# Patient Record
Sex: Female | Born: 2000
Health system: Southern US, Community
[De-identification: ages and names within clinical notes are randomized; demographics above are authoritative.]

## PROBLEM LIST (undated history)

## (undated) DIAGNOSIS — M419 Scoliosis, unspecified: Secondary | ICD-10-CM

## (undated) HISTORY — PX: TONSILLECTOMY: SUR1361

---
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
## 2019-01-04 DIAGNOSIS — M546 Pain in thoracic spine: Secondary | ICD-10-CM | POA: Diagnosis not present

## 2019-01-04 DIAGNOSIS — M9903 Segmental and somatic dysfunction of lumbar region: Secondary | ICD-10-CM | POA: Diagnosis not present

## 2019-01-04 DIAGNOSIS — M542 Cervicalgia: Secondary | ICD-10-CM | POA: Diagnosis not present

## 2019-01-04 DIAGNOSIS — M545 Low back pain: Secondary | ICD-10-CM | POA: Diagnosis not present

## 2019-01-15 DIAGNOSIS — Z68.41 Body mass index (BMI) pediatric, 5th percentile to less than 85th percentile for age: Secondary | ICD-10-CM | POA: Diagnosis not present

## 2019-01-15 DIAGNOSIS — Z0000 Encounter for general adult medical examination without abnormal findings: Secondary | ICD-10-CM | POA: Diagnosis not present

## 2019-01-15 DIAGNOSIS — Z713 Dietary counseling and surveillance: Secondary | ICD-10-CM | POA: Diagnosis not present

## 2019-01-15 DIAGNOSIS — Z7182 Exercise counseling: Secondary | ICD-10-CM | POA: Diagnosis not present

## 2019-01-30 ENCOUNTER — Ambulatory Visit: Payer: BLUE CROSS/BLUE SHIELD | Admitting: Psychiatry

## 2019-01-30 ENCOUNTER — Other Ambulatory Visit: Payer: Self-pay

## 2019-01-30 ENCOUNTER — Encounter: Payer: Self-pay | Admitting: Psychiatry

## 2019-01-30 VITALS — Ht 70.0 in | Wt 156.0 lb

## 2019-01-30 DIAGNOSIS — F411 Generalized anxiety disorder: Secondary | ICD-10-CM

## 2019-01-30 DIAGNOSIS — F341 Dysthymic disorder: Secondary | ICD-10-CM

## 2019-01-30 DIAGNOSIS — F410 Panic disorder [episodic paroxysmal anxiety] without agoraphobia: Secondary | ICD-10-CM | POA: Diagnosis not present

## 2019-01-30 MED ORDER — ESCITALOPRAM OXALATE 5 MG PO TABS: 5.0000 mg | ORAL_TABLET | Freq: Every day | ORAL | 0 refills | Status: DC

## 2019-01-30 NOTE — Progress Notes (Signed)
[facility]  Patient ID: Penny Carter,  MRN: [id number]  PCP: Ermalinda Barrios, MD  Date of Evaluation: 01/30/2019 Time spent:10 minutes from 0905 to 0915  Chief Complaint:  Chief Complaint    Anxiety; Depression; Panic Attack      HISTORY/CURRENT STATUS: Penny Carter is seen individually onsite face-to-face with consent not collateral for psychiatric interview and exam in 23-month evaluation and management of anxiety and dysthymia with remission of panic.  At last appointment she had the peak stress of 17 hours academics at [employer] state and has now completed the next semester online stay at home again doing well.  She is confident about applied mathematics major and career also having a minor in [place], planning study abroad in [place] when possible.  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  She plans the summer as a time to taper off her Lexapro and cope by skills she has acquired, though she has generally declined therapy also at [employer] state where she knows it is available as part of her education.  She remains on her OCP and Lexapro 10 mg nightly not requiring the as needed rescue Tranxene, Inderal, or Vistaril available but never needed from last appointment for increasing anxiety.  She has no mania, psychosis, suicidality or dissociation.   Individual Medical History/ Review of Systems: Changes? :Yes She continues OCP for irregular menses having a history of scoliosis otherwise general medical negative.  Allergies: Latex  Current Medications:  Current Outpatient Medications:  .  escitalopram (LEXAPRO) 5 MG tablet, Take 1 tablet (5 mg total) by mouth at bedtime., Disp: 30 tablet, Rfl: 0 .  norethindrone-ethinyl estradiol (JUNEL FE,GILDESS FE,LOESTRIN FE) 1-20 MG-MCG tablet, Take 1 tablet by mouth daily., Disp: , Rfl:    Medication Side Effects: none  Family Medical/ Social History: Changes? No  MENTAL HEALTH  EXAM:  Height 5\' 10"  (1.778 m), weight 156 lb (70.8 kg).Body mass index is 22.38 kg/m.  Otherwise deferred for coronavirus pandemic  General Appearance: Casual and Well Groomed  Eye Contact:  Good  Speech:  Clear and Coherent, Normal Rate and Talkative  Volume:  Normal  Mood:  Euthymic  Affect:  Anxious full range of affect  Thought Process:  Goal Directed  Orientation:  Full (Time, Place, and Person)  Thought Content: Obsessions   Suicidal Thoughts:  No  Homicidal Thoughts:  No  Memory:  Immediate;   Good Remote;   Good  Judgement:  Good  Insight:  Good  Psychomotor Activity:  Normal  Concentration:  Concentration: Good and Attention Span: Good  Recall:  Good  Fund of Knowledge: Good  Language: Good  Assets:  Intimacy Social Support Vocational/Educational  ADL's:  Intact  Cognition: WNL  Prognosis:  Good    DIAGNOSES:    ICD-10-CM   1. GAD (generalized anxiety disorder) F41.1 escitalopram (LEXAPRO) 5 MG tablet  2. Panic disorder F41.0 escitalopram (LEXAPRO) 5 MG tablet  3. Severe early onset persistent depressive disorder in full remission with anxious distress and pure persistent depressive syndrome F34.1 escitalopram (LEXAPRO) 5 MG tablet    Receiving Psychotherapy: No    RECOMMENDATIONS: Goal is to discontinue 3 years of Lexapro 10 mg nightly, so she is E scribed 5 mg tablets to take 1 every bedtime for 2 weeks, then one half every bedtime for 2 weeks then stop as #30 with no refill sent to [facility] on [address] for GAD and dysthymia in remission.  We discuss management of discontinuation or relapse symptoms as return if needed for recurrence of symptoms, otherwise closure secures college and career readiness and fulfilling social relations.   Chauncey MannGlenn E , MD

## 2019-02-09 ENCOUNTER — Other Ambulatory Visit: Payer: Self-pay | Admitting: Psychiatry

## 2019-02-09 DIAGNOSIS — F410 Panic disorder [episodic paroxysmal anxiety] without agoraphobia: Secondary | ICD-10-CM

## 2019-02-09 DIAGNOSIS — F411 Generalized anxiety disorder: Secondary | ICD-10-CM

## 2019-02-09 DIAGNOSIS — F341 Dysthymic disorder: Secondary | ICD-10-CM

## 2019-02-19 DIAGNOSIS — R42 Dizziness and giddiness: Secondary | ICD-10-CM | POA: Diagnosis not present

## 2019-04-19 DIAGNOSIS — Z20818 Contact with and (suspected) exposure to other bacterial communicable diseases: Secondary | ICD-10-CM | POA: Diagnosis not present

## 2019-07-13 DIAGNOSIS — Z20828 Contact with and (suspected) exposure to other viral communicable diseases: Secondary | ICD-10-CM | POA: Diagnosis not present

## 2019-07-23 DIAGNOSIS — Z6820 Body mass index (BMI) 20.0-20.9, adult: Secondary | ICD-10-CM | POA: Diagnosis not present

## 2019-07-23 DIAGNOSIS — Z118 Encounter for screening for other infectious and parasitic diseases: Secondary | ICD-10-CM | POA: Diagnosis not present

## 2019-07-23 DIAGNOSIS — Z01419 Encounter for gynecological examination (general) (routine) without abnormal findings: Secondary | ICD-10-CM | POA: Diagnosis not present

## 2019-07-30 DIAGNOSIS — D170 Benign lipomatous neoplasm of skin and subcutaneous tissue of head, face and neck: Secondary | ICD-10-CM | POA: Diagnosis not present

## 2019-07-31 DIAGNOSIS — Z3042 Encounter for surveillance of injectable contraceptive: Secondary | ICD-10-CM | POA: Diagnosis not present

## 2019-08-28 ENCOUNTER — Other Ambulatory Visit: Payer: Self-pay

## 2019-08-28 ENCOUNTER — Encounter: Payer: Self-pay | Admitting: Psychiatry

## 2019-08-28 ENCOUNTER — Ambulatory Visit (INDEPENDENT_AMBULATORY_CARE_PROVIDER_SITE_OTHER): Payer: BC Managed Care – PPO | Admitting: Psychiatry

## 2019-08-28 VITALS — Ht 70.0 in | Wt 142.0 lb

## 2019-08-28 DIAGNOSIS — F411 Generalized anxiety disorder: Secondary | ICD-10-CM

## 2019-08-28 DIAGNOSIS — F341 Dysthymic disorder: Secondary | ICD-10-CM

## 2019-08-28 DIAGNOSIS — F410 Panic disorder [episodic paroxysmal anxiety] without agoraphobia: Secondary | ICD-10-CM | POA: Diagnosis not present

## 2019-08-28 MED ORDER — ESCITALOPRAM OXALATE 10 MG PO TABS: 10.0000 mg | ORAL_TABLET | Freq: Every day | ORAL | 1 refills | Status: DC

## 2019-08-28 NOTE — Progress Notes (Signed)
[facility]  Patient ID: Penny Carter,  MRN: [id number]  PCP: Penny Sears, MD (Inactive)  Date of Evaluation: 08/28/2019 Time spent:20 minutes from 1040 to 1100  Chief Complaint:  Chief Complaint    Depression; Anxiety; Panic Attack      HISTORY/CURRENT STATUS: Penny Carter is seen onsite in office face-to-face 20 minutes with consent with epic collateral for psychiatric interview and exam in 71-month evaluation and management of panic and generalized anxiety disorders and comorbid dysthymic disorder with anxious distress.  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  She was on Lexapro for 3-1/2 years as she finished high school and started college always very conservative with medication taking the 5 mg dose for the first 4 months and then switching to 10 mg nightly tolerated well with efficacy though still remaining generally modestly anxious.  Off of Lexapro this fall, her grades were A's and B's and she had a job at [employer], however she experienced crying spells and depression worse 1 month ago and has had 1 panic attack last weekend.  She started therapy with mother's friend who does virtual psychotherapy from her [place] location which patient has found helpful but not resolving.  In fact, she now presents whether seasonal, therapy conclusion, or personal recapitulation to restart medication.  She initially discusses her question of the Lexapro possibly being inadequate therapeutically as she has recurrence now months later  I cannot agree with her that the relapse would be due to the Lexapro in any way but understand the need for hope in resolving the problems again for which starting new medication is certainly appropriate though not medically necessary.  She  does not wish to consider currently the missing out on study abroad in [place] when she is majoring in [employer] with a minor in Spanish plan that is part of her loss.  She also missed a cruise because of Covid.  She has never accepted prescriptions for as needed use such as for panic including Tranxene, Inderal, and Vistaril.  Patient can dissipate strong painful emotion in the session somatically and nonverbally but she will not discuss options for treatment that can be intensified if needed.  She knows that maternal great great grandmother had anxiety even at age 49 years.  The patient's younger sister and mother have generally been controlling in style while father disengaged from the family at divorce with a lawsuit against the therapist providing the counseling surrounding divorce.  Patient has no suicidality, psychosis, delirium, or mania.  Anxiety  Presents for follow-up visit. Symptoms include excessive worry, muscle tension, nausea, presyncopy, nervous/anxious behavior, decreased concentration, depressed mood, and panic. Patient reports no confusion, insomnia or suicidal ideas. Symptoms occur most days. The most recent panic episode lasted 30 minutes last Sunday. The severity of symptoms is moderate. The patient sleeps 6 hours per night. The quality of sleep is good. Nighttime awakenings: none. Compliance with medications is 76-100%. Side effects of treatment include GI discomfort.   Depression  This is a chronic problem that started more than 4 year ago.   The recurrence of the problem now occurs daily.  The problem has fully recurred since recovery seemed likely 6 months ago.  Associated symptoms include decreased concentration, crying spells, feelings of guilt in sense of loss, unwillingness to discuss dissolution of  family impact upon all, and hopelessness .  Symptoms do not include mood swings, insomnia, or suicidal ideas.  Past medical history includes anxiety.    Individual Medical  History/ Review of Systems: Changes? :Yes  wieght was 169 pounds 1 year ago, 156 some 6 months ago, and now 142 pounds relative to diet and exercise.  She continues birth control pills.   Allergies: Latex  Current Medications:  Current Outpatient Medications:  .  escitalopram (LEXAPRO) 10 MG tablet, Take 1 tablet (10 mg total) by mouth at bedtime., Disp: 90 tablet, Rfl: 1 .  norethindrone-ethinyl estradiol (JUNEL FE,GILDESS FE,LOESTRIN FE) 1-20 MG-MCG tablet, Take 1 tablet by mouth daily., Disp: , Rfl:    Medication Side Effects: none  Family Medical/ Social History: Changes? No  MENTAL HEALTH EXAM:  Height 5\' 10"  (1.778 m), weight 142 lb (64.4 kg).Body mass index is 20.37 kg/m. Muscle strengths and tone 5/5, postural reflexes and gait 0/0, and AIMS = 0 otherwise deferred for coronavirus shutdown  General Appearance: Casual, Guarded and Well Groomed  Eye Contact:  Fair  Speech:  Blocked, Clear and Coherent, Normal Rate and Talkative  Volume:  Normal  Mood:  Anxious, Depressed, Dysphoric, Hopeless and Worthless  Affect:  Congruent, Depressed, Inappropriate, Restricted, Tearful and Anxious  Thought Process:  Coherent, Goal Directed, Irrelevant and Descriptions of Associations: Tangential  Orientation:  Full (Time, Place, and Person)  Thought Content: Logical, Ilusions, Obsessions, Rumination and Tangential   Suicidal Thoughts:  No  Homicidal Thoughts:  No  Memory:  Immediate;   Good Remote;   Good  Judgement:  Fair  Insight:  Fair  Psychomotor Activity:  Normal, Decreased and Mannerisms  Concentration:  Concentration: Fair and Attention Span: Good  Recall:  Good  Fund of Knowledge: Good  Language: Good  Assets:  Physical Health Resilience Talents/Skills Vocational/Educational  ADL's:  Intact  Cognition: WNL  Prognosis:  Good    DIAGNOSES:    ICD-10-CM   1. Persistent depressive disorder with anxious distress, currently moderate  F34.1 escitalopram (LEXAPRO) 10 MG tablet   2. GAD (generalized anxiety disorder)  F41.1 escitalopram (LEXAPRO) 10 MG tablet  3. Panic disorder  F41.0 escitalopram (LEXAPRO) 10 MG tablet    Receiving Psychotherapy: Yes With psychotherapist who is mother's friend by telemedicine from   RECOMMENDATIONS: The patient seems to feel better by the end of the session and function somewhat better.  However, she then closes the discussion about medication options and other aspects of treatment, stating she will be okay and just needs to restart her Lexapro.  She allows psychosupportive psychoeducation on diagnoses including potential clinical courses and treatment needs and options.  She declines to change or otherwise add other medication.  Lexapro is E scribed 10 mg every bedtime sent as #90 with 1 refill to [facility] for dysthymia, generalized anxiety, and history of panic disorder.  Patient understands that follow-up is necessary in 3 to 6 weeks if symptoms are not responding fully as she by the closure of session expects the same beneficial response as for last course of treatment.  She only allows the reception desk to schedule an appointment in 6 months after her next semester is over but understands ways to contact me or return early even if by telemedicine.  Prevention and monitoring, safety hygiene, and crisis plans if needed are reviewed and updated.   [place], MD

## 2019-09-11 ENCOUNTER — Ambulatory Visit: Payer: Self-pay | Attending: Internal Medicine

## 2019-09-11 ENCOUNTER — Other Ambulatory Visit: Payer: BLUE CROSS/BLUE SHIELD

## 2019-09-11 DIAGNOSIS — Z20822 Contact with and (suspected) exposure to covid-19: Secondary | ICD-10-CM | POA: Insufficient documentation

## 2019-09-12 LAB — NOVEL CORONAVIRUS, NAA: SARS-CoV-2, NAA: NOT DETECTED

## 2019-10-25 DIAGNOSIS — Z3042 Encounter for surveillance of injectable contraceptive: Secondary | ICD-10-CM | POA: Diagnosis not present

## 2020-02-17 ENCOUNTER — Other Ambulatory Visit: Payer: Self-pay | Admitting: Psychiatry

## 2020-02-17 DIAGNOSIS — F410 Panic disorder [episodic paroxysmal anxiety] without agoraphobia: Secondary | ICD-10-CM

## 2020-02-17 DIAGNOSIS — F341 Dysthymic disorder: Secondary | ICD-10-CM

## 2020-02-17 DIAGNOSIS — F411 Generalized anxiety disorder: Secondary | ICD-10-CM

## 2020-02-26 ENCOUNTER — Other Ambulatory Visit: Payer: Self-pay

## 2020-02-26 ENCOUNTER — Encounter: Payer: Self-pay | Admitting: Psychiatry

## 2020-02-26 ENCOUNTER — Ambulatory Visit (INDEPENDENT_AMBULATORY_CARE_PROVIDER_SITE_OTHER): Payer: BC Managed Care – PPO | Admitting: Psychiatry

## 2020-02-26 VITALS — Ht 70.0 in | Wt 144.0 lb

## 2020-02-26 DIAGNOSIS — F411 Generalized anxiety disorder: Secondary | ICD-10-CM

## 2020-02-26 DIAGNOSIS — F341 Dysthymic disorder: Secondary | ICD-10-CM | POA: Diagnosis not present

## 2020-02-26 DIAGNOSIS — F410 Panic disorder [episodic paroxysmal anxiety] without agoraphobia: Secondary | ICD-10-CM

## 2020-02-26 MED ORDER — ESCITALOPRAM OXALATE 20 MG PO TABS: 20.0000 mg | ORAL_TABLET | Freq: Every day | ORAL | 1 refills | Status: DC

## 2020-02-26 NOTE — Progress Notes (Signed)
[facility]  Patient ID: Penny Carter,  MRN: [id number]  PCP: Ermalinda Barrios, MD  Date of Evaluation: 02/26/2020 Time spent:25 minutes from 1010 to 1035  Chief Complaint:  Chief Complaint    Depression; Anxiety; Panic Attack      HISTORY/CURRENT STATUS: Penny Carter is seen Onsite in office 20 minutes face-to-face individually with consent with epic collateral for psychiatric interview and exam in 62-month evaluation and management of dysthymia with anxious distress, generalized anxiety, and panic disorder.  Patient is stressed as she expected while grieving in a protracted way for the death of maternal great grandmother approximately 3 weeks ago.  At last appointment she mentioned that she identified with this maternal great grandmother who had anxiety even in her 64's.  The patient had experienced panic when she learned of the grandmother being in the hospital care in the interim.  Patient completed therapy with the friend of mother from [place] by virtual with closure improvement.  However with the loss of the great grandmother, the patient is again very anxious with easy panic and despair at least moderate.  She will be back onsite in all classes at [employer] in the fall as a junior having only one hybrid class I day weekly through the school year.  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  She declines Ativan for her current anxiety, and also for depression she is agreeable to increasing Lexapro.  She had started Lexapro at 5 mg for 4 months and advanced to 10 mg since then for the last nearly 4 years.  Adult maturation is evident including in responsibilities, and she requests to increase the Lexapro for recurrent depressive episodes  She has no mania, suicidality, psychosis or delirium    Anxiety             Presents forfollow-upvisit. Symptoms includeexcessive worry,muscle tension,  nervous/anxious behavior, decreased concentration,depressed mood, and panic attack. Patient reports noconfusion, insomnia, nausea, presyncope,or suicidal ideas. Symptoms occurmost days. The most recent panic episode lasted 20 minutes. The severity of symptoms ismoderate. The patient sleeps6 hoursper night. The quality of sleep isgood. Nighttime awakenings:none. Compliance with medications is76-100%. Side effects of treatment includeGI discomfort.  Depression             This is a chronicproblem that started more than 4 year ago occurring daily recently intensified with mourning the death of great grandmother.Associated symptoms include decreased concentration, crying spells, feelings of guilt, sense of loss, with more dissolution of family, and hopelessness .  Symptomsdo not include mood swings, insomnia, or suicidal ideas.Past medical history includes anxiety.  Individual Medical History/ Review of Systems: Changes? :Yes Weight is up 2 pounds from last appointment but still down 12 pounds from the preceding 1 year ago.  She has Depo-Provera now instead of the birth control pill.  Otherwise general health is good.  Allergies: Latex  Current Medications:  Current Outpatient Medications:  .  escitalopram (LEXAPRO) 20 MG tablet, Take 1 tablet (20 mg total) by mouth at bedtime., Disp: 90 tablet, Rfl: 1 .  norethindrone-ethinyl estradiol (JUNEL FE,GILDESS FE,LOESTRIN FE) 1-20 MG-MCG tablet, Take 1 tablet by mouth daily., Disp: , Rfl:   Medication Side Effects: none  Family Medical/ Social History: Changes? No  MENTAL HEALTH EXAM:  Height 5\' 10"  (1.778 m), weight 144 lb (65.3 kg).Body mass index is 20.66 kg/m. Muscle strengths and tone 5/5, postural reflexes and gait 0/0,  and AIMS = 0.  General Appearance: Casual, Meticulous and Well Groomed  Eye Contact:  Good  Speech:  Clear and Coherent, Normal Rate and Talkative  Volume:  Normal  Mood:  Anxious, Depressed, Dysphoric and  Hopeless  Affect:  Congruent, Depressed, Inappropriate, Full Range and Anxious  Thought Process:  Coherent, Goal Directed, Irrelevant and Descriptions of Associations: Tangential  Orientation:  Full (Time, Place, and Person)  Thought Content: Rumination and Tangential   Suicidal Thoughts:  No  Homicidal Thoughts:  No  Memory:  Immediate;   Good and Fair Remote;   Good  Judgement:  Good  Insight:  Fair  Psychomotor Activity:  Normal and Mannerisms  Concentration:  Concentration: Fair and Attention Span: Good  Recall:  Good  Fund of Knowledge: Good  Language: Good  Assets:  Desire for Improvement Resilience Social Support Talents/Skills  ADL's:  Intact  Cognition: WNL  Prognosis:  Good    DIAGNOSES:    ICD-10-CM   1. Persistent depressive disorder with anxious distress, currently moderate  F34.1 escitalopram (LEXAPRO) 20 MG tablet  2. Panic disorder  F41.0 escitalopram (LEXAPRO) 20 MG tablet  3. 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  Patient can likely work through all of these changes now which had been fixated and suspended in time.  Cognitive behavioral grief and loss, social skills, and frustration management are integrated with symptom treatment matching for medication concluding to increase Lexapro 10 mg existing tablet supply to 1.5 tablets total 15 mg every bedtime until supply exhausted then advance to 20 mg tablet as 1  every bedtime as #90 with 1 refill sent to [facility] for dysthymia, generalized anxiety, and panic disorder.  She continues birth control pill and has not needed rescue medication but may call for Ativan as needed for panic or anxiety.  She returns for follow-up in 6 months refusing to schedule sooner unless found necessary. Chauncey Mann, MD

## 2020-02-29 DIAGNOSIS — Z3202 Encounter for pregnancy test, result negative: Secondary | ICD-10-CM | POA: Diagnosis not present

## 2020-02-29 DIAGNOSIS — Z3042 Encounter for surveillance of injectable contraceptive: Secondary | ICD-10-CM | POA: Diagnosis not present

## 2020-05-28 DIAGNOSIS — R070 Pain in throat: Secondary | ICD-10-CM | POA: Diagnosis not present

## 2020-05-28 DIAGNOSIS — J029 Acute pharyngitis, unspecified: Secondary | ICD-10-CM | POA: Diagnosis not present

## 2020-05-30 DIAGNOSIS — Z3042 Encounter for surveillance of injectable contraceptive: Secondary | ICD-10-CM | POA: Diagnosis not present

## 2020-06-18 ENCOUNTER — Encounter: Payer: Self-pay | Admitting: Psychiatry

## 2020-08-26 ENCOUNTER — Ambulatory Visit: Payer: Self-pay | Admitting: Psychiatry

## 2020-08-26 DIAGNOSIS — Z6822 Body mass index (BMI) 22.0-22.9, adult: Secondary | ICD-10-CM | POA: Diagnosis not present

## 2020-08-26 DIAGNOSIS — Z01419 Encounter for gynecological examination (general) (routine) without abnormal findings: Secondary | ICD-10-CM | POA: Diagnosis not present

## 2020-08-26 DIAGNOSIS — Z113 Encounter for screening for infections with a predominantly sexual mode of transmission: Secondary | ICD-10-CM | POA: Diagnosis not present

## 2020-08-27 ENCOUNTER — Ambulatory Visit: Payer: Self-pay | Admitting: Psychiatry

## 2020-09-01 ENCOUNTER — Other Ambulatory Visit: Payer: Self-pay | Admitting: Psychiatry

## 2020-09-01 DIAGNOSIS — F341 Dysthymic disorder: Secondary | ICD-10-CM

## 2020-09-01 DIAGNOSIS — F411 Generalized anxiety disorder: Secondary | ICD-10-CM

## 2020-09-01 DIAGNOSIS — F410 Panic disorder [episodic paroxysmal anxiety] without agoraphobia: Secondary | ICD-10-CM

## 2020-09-05 DIAGNOSIS — Z3042 Encounter for surveillance of injectable contraceptive: Secondary | ICD-10-CM | POA: Diagnosis not present

## 2020-09-05 DIAGNOSIS — Z3200 Encounter for pregnancy test, result unknown: Secondary | ICD-10-CM | POA: Diagnosis not present

## 2020-09-19 ENCOUNTER — Ambulatory Visit: Payer: Self-pay | Admitting: Adult Health

## 2020-10-10 ENCOUNTER — Other Ambulatory Visit: Payer: Self-pay

## 2020-10-10 ENCOUNTER — Encounter: Payer: Self-pay | Admitting: Adult Health

## 2020-10-10 ENCOUNTER — Ambulatory Visit (INDEPENDENT_AMBULATORY_CARE_PROVIDER_SITE_OTHER): Payer: BC Managed Care – PPO | Admitting: Adult Health

## 2020-10-10 DIAGNOSIS — F411 Generalized anxiety disorder: Secondary | ICD-10-CM

## 2020-10-10 DIAGNOSIS — F341 Dysthymic disorder: Secondary | ICD-10-CM | POA: Diagnosis not present

## 2020-10-10 DIAGNOSIS — F410 Panic disorder [episodic paroxysmal anxiety] without agoraphobia: Secondary | ICD-10-CM | POA: Diagnosis not present

## 2020-10-10 MED ORDER — ESCITALOPRAM OXALATE 20 MG PO TABS: 20.0000 mg | ORAL_TABLET | Freq: Every day | ORAL | 3 refills | Status: DC

## 2020-10-10 NOTE — Progress Notes (Signed)
Penny Carter [id number] 2001-01-12 20 y.o.  Subjective:   Patient ID:  Penny Carter is a 20 y.o. (DOB 10/22/2000) female.  Chief Complaint: No chief complaint on file.   HPI Penny Carter presents to the office today for follow-up of GAD, panic disorder, persistent depression.  Describes mood today as "ok". Pleasant. Mood symptoms - denies depression, anxiety, and irritability. Stating "I'm doing pretty good". Reports Lexapro 20mg  continues to work well for her. Stable interest and motivation. Taking medications as prescribed.  Energy levels stable. Active, has a regular exercise routine.  Enjoys some usual interests and activities. Student at [employer] - junior. Mother and sister local. Spending time with family and friends. Trip to [facility] planned over summer break. Appetite adequate. Weight stable. Sleeps well most nights. Averages 9 to 10 hours. Focus and concentration stable. Completing tasks. Managing aspects of household. 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 Denies SI or HI.  Denies AH or VH.  Previous medication trials: Denies   Review of Systems:  Review of Systems  Musculoskeletal: Negative for gait problem.  Neurological: Negative for tremors.  Psychiatric/Behavioral:       Please refer to HPI    Medications: I have reviewed the patient's current medications.  Current Outpatient Medications  Medication Sig Dispense Refill  . escitalopram (LEXAPRO) 20 MG tablet Take 1 tablet (20 mg total) by mouth at bedtime. 90 tablet 3  . norethindrone-ethinyl estradiol (JUNEL FE,GILDESS FE,LOESTRIN FE) 1-20 MG-MCG tablet Take 1 tablet by mouth daily.     No current facility-administered medications for this visit.    Medication Side Effects: None  Allergies:  Allergies  Allergen Reactions  . Latex     Past Medical History:  Diagnosis Date  . Scoliosis     No family history on file.  Social History    Socioeconomic History  . Marital status: Single    Spouse name: Not on file  . Number of children: Not on file  . Years of education: Not on file  . Highest education level: Not on file  Occupational History  . Not on file  Tobacco Use  . Smoking status: Never Smoker  . Smokeless tobacco: Never Used  Vaping Use  . Vaping Use: Never used  Substance and Sexual Activity  . Alcohol use: No  . Drug use: No  . Sexual activity: Not on file  Other Topics Concern  . Not on file  Social History Narrative  . Not on file   Social Determinants of Health   Financial Resource Strain: Not on file  Food Insecurity: Not on file  Transportation Needs: Not on file  Physical Activity: Not on file  Stress: Not on file  Social Connections: Not on file  Intimate Partner Violence: Not on file    Past Medical History, Surgical history, Social history, and Family history were reviewed and updated as appropriate.   Please see review of systems for further details on the patient's review from today.   Objective:   Physical Exam:  There were no vitals taken for this visit.  Physical Exam Constitutional:      General: She is not in acute distress. Musculoskeletal:        General: No deformity.  Neurological:     Mental Status: She is alert and oriented to person, place, and time.     Coordination: Coordination normal.  Psychiatric:        Attention  and Perception: Attention and perception normal. She does not perceive auditory or visual hallucinations.        Mood and Affect: Mood normal. Mood is not anxious or depressed. Affect is not labile, blunt, angry or inappropriate.        Speech: Speech normal.        Behavior: Behavior normal.        Thought Content: Thought content normal. Thought content is not paranoid or delusional. Thought content does not include homicidal or suicidal ideation. Thought content does not include homicidal or suicidal plan.        Cognition and Memory:  Cognition and memory normal.        Judgment: Judgment normal.     Comments: Insight intact     Lab Review:  No results found for: NA, K, CL, CO2, GLUCOSE, BUN, CREATININE, CALCIUM, PROT, ALBUMIN, AST, ALT, ALKPHOS, BILITOT, GFRNONAA, GFRAA  No results found for: WBC, RBC, HGB, HCT, PLT, MCV, MCH, MCHC, RDW, LYMPHSABS, MONOABS, EOSABS, BASOSABS  No results found for: POCLITH, LITHIUM   No results found for: PHENYTOIN, PHENOBARB, VALPROATE, CBMZ   .res Assessment: Plan:    Plan:  PDMP reviewed  1. Lexapro 20mg    Read and reviewed note with patient for accuracy.   RTC 6 months  Patient advised to contact office with any questions, adverse effects, or acute worsening in signs and symptoms.   Diagnoses and all orders for this visit:  Persistent depressive disorder with anxious distress, currently moderate -     escitalopram (LEXAPRO) 20 MG tablet; Take 1 tablet (20 mg total) by mouth at bedtime.  Panic disorder -     escitalopram (LEXAPRO) 20 MG tablet; Take 1 tablet (20 mg total) by mouth at bedtime.  GAD (generalized anxiety disorder) -     escitalopram (LEXAPRO) 20 MG tablet; Take 1 tablet (20 mg total) by mouth at bedtime.     Please see After Visit Summary for patient specific instructions.  Future Appointments  Date Time Provider Department Center  04/13/2021  9:00 AM Penny Carter, 04/15/2021, NP [facility] None    No orders of the defined types were placed in this encounter.   -------------------------------

## 2020-10-23 DIAGNOSIS — M533 Sacrococcygeal disorders, not elsewhere classified: Secondary | ICD-10-CM | POA: Diagnosis not present

## 2020-10-23 DIAGNOSIS — S322XXA Fracture of coccyx, initial encounter for closed fracture: Secondary | ICD-10-CM | POA: Diagnosis not present

## 2021-01-06 DIAGNOSIS — Z3042 Encounter for surveillance of injectable contraceptive: Secondary | ICD-10-CM | POA: Diagnosis not present

## 2021-01-06 DIAGNOSIS — Z309 Encounter for contraceptive management, unspecified: Secondary | ICD-10-CM | POA: Diagnosis not present

## 2021-01-08 DIAGNOSIS — Z3200 Encounter for pregnancy test, result unknown: Secondary | ICD-10-CM | POA: Diagnosis not present

## 2021-01-08 DIAGNOSIS — Z3042 Encounter for surveillance of injectable contraceptive: Secondary | ICD-10-CM | POA: Diagnosis not present

## 2021-01-20 DIAGNOSIS — M25561 Pain in right knee: Secondary | ICD-10-CM | POA: Diagnosis not present

## 2021-01-20 DIAGNOSIS — L700 Acne vulgaris: Secondary | ICD-10-CM | POA: Diagnosis not present

## 2021-01-20 DIAGNOSIS — Z79899 Other long term (current) drug therapy: Secondary | ICD-10-CM | POA: Diagnosis not present

## 2021-04-13 ENCOUNTER — Telehealth: Payer: Self-pay | Admitting: Adult Health

## 2021-04-13 ENCOUNTER — Ambulatory Visit: Payer: BC Managed Care – PPO | Admitting: Adult Health

## 2021-04-13 DIAGNOSIS — Z3202 Encounter for pregnancy test, result negative: Secondary | ICD-10-CM | POA: Diagnosis not present

## 2021-04-13 DIAGNOSIS — Z3042 Encounter for surveillance of injectable contraceptive: Secondary | ICD-10-CM | POA: Diagnosis not present

## 2021-04-13 NOTE — Telephone Encounter (Signed)
Requesting refill on Lexapro called to:  [id number] [place], [place] - [address]  Phone:  [phone]  Fax:  [phone]

## 2021-04-13 NOTE — Telephone Encounter (Signed)
Please schedule appt

## 2021-04-14 ENCOUNTER — Other Ambulatory Visit: Payer: Self-pay

## 2021-04-14 DIAGNOSIS — Z23 Encounter for immunization: Secondary | ICD-10-CM | POA: Diagnosis not present

## 2021-04-14 DIAGNOSIS — Z0000 Encounter for general adult medical examination without abnormal findings: Secondary | ICD-10-CM | POA: Diagnosis not present

## 2021-04-14 DIAGNOSIS — Z1331 Encounter for screening for depression: Secondary | ICD-10-CM | POA: Diagnosis not present

## 2021-04-14 DIAGNOSIS — F411 Generalized anxiety disorder: Secondary | ICD-10-CM

## 2021-04-14 DIAGNOSIS — F341 Dysthymic disorder: Secondary | ICD-10-CM

## 2021-04-14 DIAGNOSIS — L700 Acne vulgaris: Secondary | ICD-10-CM | POA: Diagnosis not present

## 2021-04-14 DIAGNOSIS — F410 Panic disorder [episodic paroxysmal anxiety] without agoraphobia: Secondary | ICD-10-CM

## 2021-04-14 MED ORDER — ESCITALOPRAM OXALATE 20 MG PO TABS: 20.0000 mg | ORAL_TABLET | Freq: Every day | ORAL | 0 refills | Status: DC

## 2021-04-14 NOTE — Telephone Encounter (Signed)
Pt has an appt on 9/2

## 2021-04-14 NOTE — Telephone Encounter (Signed)
Rx sent 

## 2021-05-01 ENCOUNTER — Encounter: Payer: Self-pay | Admitting: Adult Health

## 2021-05-01 ENCOUNTER — Telehealth (INDEPENDENT_AMBULATORY_CARE_PROVIDER_SITE_OTHER): Payer: BC Managed Care – PPO | Admitting: Adult Health

## 2021-05-01 DIAGNOSIS — F411 Generalized anxiety disorder: Secondary | ICD-10-CM | POA: Diagnosis not present

## 2021-05-01 DIAGNOSIS — F410 Panic disorder [episodic paroxysmal anxiety] without agoraphobia: Secondary | ICD-10-CM

## 2021-05-01 DIAGNOSIS — F341 Dysthymic disorder: Secondary | ICD-10-CM

## 2021-05-01 MED ORDER — ESCITALOPRAM OXALATE 20 MG PO TABS: 20.0000 mg | ORAL_TABLET | Freq: Every day | ORAL | 0 refills | Status: DC

## 2021-05-01 NOTE — Progress Notes (Signed)
Penny Carter [id number] 28-Feb-2001 20 y.o.  Virtual Visit via Video Note  I connected with pt @ on 05/01/21 at 11:20 AM EDT by a video enabled telemedicine application and verified that I am speaking with the correct person using two identifiers.   I discussed the limitations of evaluation and management by telemedicine and the availability of in person appointments. The patient expressed understanding and agreed to proceed.  I discussed the assessment and treatment plan with the patient. The patient was provided an opportunity to ask questions and all were answered. The patient agreed with the plan and demonstrated an understanding of the instructions.   The patient was advised to call back or seek an in-person evaluation if the symptoms worsen or if the condition fails to improve as anticipated.  I provided 20 minutes of non-face-to-face time during this encounter.  The patient was located at home.  The provider was located at [facility].   Dorothyann Gibbs, NP   Subjective:   Patient ID:  Penny Carter is a 20 y.o. (DOB 2001-07-05) female.  Chief Complaint: No chief complaint on file.   HPI Penny Carter presents for follow-up of GAD, panic disorder, persistent depression.  Describes mood today as "ok". Pleasant. Mood symptoms - denies depression, anxiety, and irritability. Stating "I'm doing  good". Feels like Lexapro 20mg  continues to work well for her. Stable interest and motivation. Taking medications as prescribed.  Energy levels stable. Active, has a regular exercise routine.  Enjoys some usual interests and activities. 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 Mother and sister local. Spending time with family and friends. Trip to [facility] planned over summer break. Appetite adequate. Weight loss - size 6. Sleeps well most nights. Averages 9 to 10 hours. Focus and concentration stable. Completing tasks. Managing aspects of household. Senior at 06-25-1976.  Denies SI or  HI.  Denies AH or VH.  Previous medication trials: Denies  Review of Systems:  Review of Systems  Musculoskeletal:  Negative for gait problem.  Neurological:  Negative for tremors.  Psychiatric/Behavioral:         Please refer to HPI   Medications: I have reviewed the patient's current medications.  Current Outpatient Medications  Medication Sig Dispense Refill   escitalopram (LEXAPRO) 20 MG tablet Take 1 tablet (20 mg total) by mouth at bedtime. 90 tablet 0   norethindrone-ethinyl estradiol (JUNEL FE,GILDESS FE,LOESTRIN FE) 1-20 MG-MCG tablet Take 1 tablet by mouth daily.     No current facility-administered medications for this visit.    Medication Side Effects: None  Allergies:  Allergies  Allergen Reactions   Latex     Past Medical History:  Diagnosis Date   Scoliosis     No family history on file.  Social History   Socioeconomic History   Marital status: Single    Spouse name: Not on file   Number of children: Not on file   Years of education: Not on file   Highest education level: Not on file  Occupational History   Not on file  Tobacco Use   Smoking status: Never   Smokeless tobacco: Never  Vaping Use   Vaping Use: Never used  Substance and Sexual Activity   Alcohol use: No   Drug use: No   Sexual activity: Not on file  Other Topics Concern   Not on file  Social History Narrative   Not on file   Social Determinants of Health   Financial Resource Strain: Not on file  Food Insecurity: Not on file  Transportation Needs: Not on file  Physical Activity: Not on file  Stress: Not on file  Social Connections: Not on file  Intimate Partner Violence: Not on file    Past Medical History, Surgical history, Social history, and Family history were reviewed and updated as appropriate.   Please see review of systems for further details on the patient's review from today.   Objective:   Physical Exam:  There were no vitals taken for this  visit.  Physical Exam Constitutional:      General: She is not in acute distress. Musculoskeletal:        General: No deformity.  Neurological:     Mental Status: She is alert and oriented to person, place, and time.     Coordination: Coordination normal.  Psychiatric:        Attention and Perception: Attention and perception normal. She does not perceive auditory or visual hallucinations.        Mood and Affect: Mood normal. Mood is not anxious or depressed. Affect is not labile, blunt, angry or inappropriate.        Speech: Speech normal.        Behavior: Behavior normal.        Thought Content: Thought content normal. Thought content is not paranoid or delusional. Thought content does not include homicidal or suicidal ideation. Thought content does not include homicidal or suicidal plan.        Cognition and Memory: Cognition and memory normal.        Judgment: Judgment normal.     Comments: Insight intact    Lab Review:  No results found for: NA, K, CL, CO2, GLUCOSE, BUN, CREATININE, CALCIUM, PROT, ALBUMIN, AST, ALT, ALKPHOS, BILITOT, GFRNONAA, GFRAA  No results found for: WBC, RBC, HGB, HCT, PLT, MCV, MCH, MCHC, RDW, LYMPHSABS, MONOABS, EOSABS, BASOSABS  No results found for: POCLITH, LITHIUM   No results found for: PHENYTOIN, PHENOBARB, VALPROATE, CBMZ   .res Assessment: Plan:     Plan:  PDMP reviewed  1. Lexapro 20mg    Consider switch to Zoloft  Read and reviewed note with patient for accuracy.   RTC 6 months  Patient advised to contact office with any questions, adverse effects, or acute worsening in signs and symptoms.  Diagnoses and all orders for this visit:  Persistent depressive disorder with anxious distress, currently moderate -     escitalopram (LEXAPRO) 20 MG tablet; Take 1 tablet (20 mg total) by mouth at bedtime.  Panic disorder -     escitalopram (LEXAPRO) 20 MG tablet; Take 1 tablet (20 mg total) by mouth at bedtime.  GAD (generalized  anxiety disorder) -     escitalopram (LEXAPRO) 20 MG tablet; Take 1 tablet (20 mg total) by mouth at bedtime.    Please see After Visit Summary for patient specific instructions.  No future appointments.  No orders of the defined types were placed in this encounter.     -------------------------------

## 2021-05-27 DIAGNOSIS — Z113 Encounter for screening for infections with a predominantly sexual mode of transmission: Secondary | ICD-10-CM | POA: Diagnosis not present

## 2021-05-27 DIAGNOSIS — Z114 Encounter for screening for human immunodeficiency virus [HIV]: Secondary | ICD-10-CM | POA: Diagnosis not present

## 2021-05-27 DIAGNOSIS — Z23 Encounter for immunization: Secondary | ICD-10-CM | POA: Diagnosis not present

## 2021-07-03 DIAGNOSIS — Z3042 Encounter for surveillance of injectable contraceptive: Secondary | ICD-10-CM | POA: Diagnosis not present

## 2021-07-16 DIAGNOSIS — M222X2 Patellofemoral disorders, left knee: Secondary | ICD-10-CM | POA: Diagnosis not present

## 2021-07-16 DIAGNOSIS — G8929 Other chronic pain: Secondary | ICD-10-CM | POA: Diagnosis not present

## 2021-07-16 DIAGNOSIS — M25561 Pain in right knee: Secondary | ICD-10-CM | POA: Diagnosis not present

## 2021-07-16 DIAGNOSIS — M25562 Pain in left knee: Secondary | ICD-10-CM | POA: Diagnosis not present

## 2021-08-25 DIAGNOSIS — Z79899 Other long term (current) drug therapy: Secondary | ICD-10-CM | POA: Diagnosis not present

## 2021-08-25 DIAGNOSIS — R0981 Nasal congestion: Secondary | ICD-10-CM | POA: Diagnosis not present

## 2021-08-25 DIAGNOSIS — R251 Tremor, unspecified: Secondary | ICD-10-CM | POA: Diagnosis not present

## 2021-08-28 ENCOUNTER — Encounter: Payer: Self-pay | Admitting: Adult Health

## 2021-08-28 ENCOUNTER — Other Ambulatory Visit: Payer: Self-pay

## 2021-08-28 ENCOUNTER — Ambulatory Visit (INDEPENDENT_AMBULATORY_CARE_PROVIDER_SITE_OTHER): Payer: BC Managed Care – PPO | Admitting: Adult Health

## 2021-08-28 DIAGNOSIS — F341 Dysthymic disorder: Secondary | ICD-10-CM

## 2021-08-28 DIAGNOSIS — F411 Generalized anxiety disorder: Secondary | ICD-10-CM | POA: Diagnosis not present

## 2021-08-28 DIAGNOSIS — F410 Panic disorder [episodic paroxysmal anxiety] without agoraphobia: Secondary | ICD-10-CM

## 2021-08-28 MED ORDER — SERTRALINE HCL 50 MG PO TABS: 50.0000 mg | ORAL_TABLET | Freq: Every day | ORAL | 5 refills | Status: DC

## 2021-08-28 NOTE — Progress Notes (Signed)
Penny Carter [id number] 2000/09/04 20 y.o.  Subjective:   Patient ID:  Penny Carter is a 20 y.o. (DOB [birth year]) female.  Chief Complaint: No chief complaint on file.   HPI Penny Carter presents to the office today for follow-up of GAD, panic disorder, persistent depression.  Describes mood today as "ok". Pleasant. Mood symptoms - denies depression, anxiety, and irritability. Stating "I'm doing ok". Concerned about side effects from the Lexapro - hand tremors - anorgasmia. Would like to try switching to Zoloft to see if symptoms improve. 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 Visiting family for holidays. Stable interest and motivation. Taking medications as prescribed.  Energy levels stable. Active, has a regular exercise routine.  Enjoys some usual interests and activities. Student. Mother and sister local. Spending time with family and friends.  Appetite adequate. Weight stable. Sleeps well most nights. Averages 9 to 10 hours. Focus and concentration stable. Completing tasks. Managing aspects of household. Senior at [employer].  Denies SI or HI.  Denies AH or VH.  Previous medication trials: Denies      Review of Systems:  Review of Systems  Musculoskeletal:  Negative for gait problem.  Neurological:  Negative for tremors.  Psychiatric/Behavioral:         Please refer to HPI   Medications: I have reviewed the patient's current medications.  Current Outpatient Medications  Medication Sig Dispense Refill   sertraline (ZOLOFT) 50 MG tablet Take 1 tablet (50 mg total) by mouth daily. 30 tablet 5   No current facility-administered medications for this visit.    Medication Side Effects: None  Allergies:  Allergies  Allergen Reactions   Latex     Past Medical History:  Diagnosis Date   Scoliosis     Past Medical History, Surgical history, Social history, and Family history were reviewed and updated as appropriate.   Please see review of  systems for further details on the patient's review from today.   Objective:   Physical Exam:  There were no vitals taken for this visit.  Physical Exam Constitutional:      General: She is not in acute distress. Musculoskeletal:        General: No deformity.  Neurological:     Mental Status: She is alert and oriented to person, place, and time.     Coordination: Coordination normal.  Psychiatric:        Attention and Perception: Attention and perception normal. She does not perceive auditory or visual hallucinations.        Mood and Affect: Mood normal. Mood is not anxious or depressed. Affect is not labile, blunt, angry or inappropriate.        Speech: Speech normal.        Behavior: Behavior normal.        Thought Content: Thought content normal. Thought content is not paranoid or delusional. Thought content does not include homicidal or suicidal ideation. Thought content does not include homicidal or suicidal plan.        Cognition and Memory: Cognition and memory normal.        Judgment: Judgment normal.     Comments: Insight intact    Lab Review:  No results found for: NA, K, CL, CO2, GLUCOSE, BUN, CREATININE, CALCIUM, PROT, ALBUMIN, AST, ALT, ALKPHOS, BILITOT, GFRNONAA, GFRAA  No results found for: WBC, RBC, HGB, HCT, PLT, MCV, MCH, MCHC, RDW, LYMPHSABS, MONOABS, EOSABS, BASOSABS  No results found for: POCLITH, LITHIUM   No  results found for: PHENYTOIN, PHENOBARB, VALPROATE, CBMZ   .res Assessment: Plan:     Plan:  PDMP reviewed  1. Lexapro 20mg  - 1/2 tablet daily x 7 days, then d/c. 2. Add Zoloft 50mg  - 1/2 tablet daily x 7 days, then one tablet daily.  RTC 2 months   Time spent with patient was 25 minutes. Greater than 50% of face to face time with patient was spent on counseling and coordination of care.     Patient advised to contact office with any questions, adverse effects, or acute worsening in signs and symptoms. Diagnoses and all orders for this  visit:  Persistent depressive disorder with anxious distress, currently moderate -     sertraline (ZOLOFT) 50 MG tablet; Take 1 tablet (50 mg total) by mouth daily.  GAD (generalized anxiety disorder) -     sertraline (ZOLOFT) 50 MG tablet; Take 1 tablet (50 mg total) by mouth daily.  Panic disorder -     sertraline (ZOLOFT) 50 MG tablet; Take 1 tablet (50 mg total) by mouth daily.     Please see After Visit Summary for patient specific instructions.  Future Appointments  Date Time Provider Department Center  02/11/2022  9:40 AM Vardaan Depascale, , NP [facility] None    No orders of the defined types were placed in this encounter.   -------------------------------

## 2021-08-31 DIAGNOSIS — L700 Acne vulgaris: Secondary | ICD-10-CM | POA: Diagnosis not present

## 2021-09-21 ENCOUNTER — Telehealth: Payer: Self-pay | Admitting: Adult Health

## 2021-09-21 ENCOUNTER — Other Ambulatory Visit: Payer: Self-pay

## 2021-09-21 DIAGNOSIS — F410 Panic disorder [episodic paroxysmal anxiety] without agoraphobia: Secondary | ICD-10-CM

## 2021-09-21 DIAGNOSIS — F341 Dysthymic disorder: Secondary | ICD-10-CM

## 2021-09-21 DIAGNOSIS — F411 Generalized anxiety disorder: Secondary | ICD-10-CM

## 2021-09-21 MED ORDER — SERTRALINE HCL 50 MG PO TABS: 75.0000 mg | ORAL_TABLET | Freq: Every day | ORAL | 5 refills | Status: DC

## 2021-09-21 NOTE — Telephone Encounter (Signed)
Patient called for refill on Zoloft 50mg . She also inquired about increasing dosage to 75mg . Please rtc to discuss. [phone]. Appt 6/15. Pharmacy [facility] [address] [place]

## 2021-09-21 NOTE — Telephone Encounter (Signed)
New Rx for 75 mg pended for provider approval.

## 2021-09-30 DIAGNOSIS — Z3042 Encounter for surveillance of injectable contraceptive: Secondary | ICD-10-CM | POA: Diagnosis not present

## 2021-10-17 ENCOUNTER — Other Ambulatory Visit: Payer: Self-pay | Admitting: Adult Health

## 2021-10-17 DIAGNOSIS — F341 Dysthymic disorder: Secondary | ICD-10-CM

## 2021-10-17 DIAGNOSIS — F411 Generalized anxiety disorder: Secondary | ICD-10-CM

## 2021-10-17 DIAGNOSIS — F410 Panic disorder [episodic paroxysmal anxiety] without agoraphobia: Secondary | ICD-10-CM

## 2021-12-30 DIAGNOSIS — J342 Deviated nasal septum: Secondary | ICD-10-CM | POA: Diagnosis not present

## 2021-12-30 DIAGNOSIS — J343 Hypertrophy of nasal turbinates: Secondary | ICD-10-CM | POA: Diagnosis not present

## 2021-12-30 DIAGNOSIS — S022XXA Fracture of nasal bones, initial encounter for closed fracture: Secondary | ICD-10-CM | POA: Diagnosis not present

## 2022-01-19 ENCOUNTER — Telehealth: Payer: Self-pay | Admitting: Adult Health

## 2022-01-19 NOTE — Telephone Encounter (Signed)
LVM to RC 

## 2022-01-19 NOTE — Telephone Encounter (Signed)
Pt lvm at 10 :55 am asking if she can switch to from zoloft back to lexapro. Please call her and let her know if that will happen. She is getting low on her medication. Phone number [phone]

## 2022-01-21 NOTE — Telephone Encounter (Signed)
It caused hand tremors though I believe.

## 2022-01-21 NOTE — Telephone Encounter (Signed)
Patient has an appt 6/15. She is asking to switch back to Lexapro from Zoloft. It looks like she was last on Lexapro the end of 2022 at a 20 mg dose. She said she felt like it helped better with anxiety and her ability to be able to handle stressful situations.

## 2022-01-22 ENCOUNTER — Other Ambulatory Visit: Payer: Self-pay | Admitting: Adult Health

## 2022-01-22 DIAGNOSIS — F411 Generalized anxiety disorder: Secondary | ICD-10-CM

## 2022-01-22 DIAGNOSIS — F410 Panic disorder [episodic paroxysmal anxiety] without agoraphobia: Secondary | ICD-10-CM

## 2022-01-22 DIAGNOSIS — F341 Dysthymic disorder: Secondary | ICD-10-CM

## 2022-01-22 NOTE — Telephone Encounter (Signed)
Patient wanting to change medication. Hold off until I can talk to her next week. Been playing phone tag.

## 2022-01-22 NOTE — Telephone Encounter (Signed)
Patient wanting to change medication. Have been playing phone tag with patient. Will try to contact her Tuesday.

## 2022-01-22 NOTE — Telephone Encounter (Signed)
LVM to RC 

## 2022-01-22 NOTE — Telephone Encounter (Signed)
We could try a low dose of Prozac instead.

## 2022-01-22 NOTE — Telephone Encounter (Signed)
Called patient and asked her about the hand tremors. She said she is still having tremors now off the Lexapro.

## 2022-01-28 NOTE — Telephone Encounter (Signed)
Is it okay she restarts Lexapro at 10 mg ?

## 2022-01-29 ENCOUNTER — Other Ambulatory Visit: Payer: Self-pay

## 2022-01-29 MED ORDER — ESCITALOPRAM OXALATE 20 MG PO TABS: ORAL_TABLET | ORAL | 0 refills | Status: DC

## 2022-01-29 NOTE — Telephone Encounter (Signed)
Pt notified of new Rx for Lexapro 20 mg take 1/2 tablet daily for 7 days then increase to 1 tablet if she prefers. Sertraline discontinued.

## 2022-02-08 DIAGNOSIS — Z01419 Encounter for gynecological examination (general) (routine) without abnormal findings: Secondary | ICD-10-CM | POA: Diagnosis not present

## 2022-02-08 DIAGNOSIS — N930 Postcoital and contact bleeding: Secondary | ICD-10-CM | POA: Diagnosis not present

## 2022-02-08 DIAGNOSIS — Z309 Encounter for contraceptive management, unspecified: Secondary | ICD-10-CM | POA: Diagnosis not present

## 2022-02-08 DIAGNOSIS — Z6820 Body mass index (BMI) 20.0-20.9, adult: Secondary | ICD-10-CM | POA: Diagnosis not present

## 2022-02-08 DIAGNOSIS — Z118 Encounter for screening for other infectious and parasitic diseases: Secondary | ICD-10-CM | POA: Diagnosis not present

## 2022-02-10 DIAGNOSIS — S022XXA Fracture of nasal bones, initial encounter for closed fracture: Secondary | ICD-10-CM | POA: Diagnosis not present

## 2022-02-10 DIAGNOSIS — J343 Hypertrophy of nasal turbinates: Secondary | ICD-10-CM | POA: Diagnosis not present

## 2022-02-11 ENCOUNTER — Ambulatory Visit: Payer: BC Managed Care – PPO | Admitting: Adult Health

## 2022-02-11 ENCOUNTER — Telehealth (INDEPENDENT_AMBULATORY_CARE_PROVIDER_SITE_OTHER): Payer: BC Managed Care – PPO | Admitting: Adult Health

## 2022-02-11 ENCOUNTER — Encounter: Payer: Self-pay | Admitting: Adult Health

## 2022-02-11 DIAGNOSIS — F411 Generalized anxiety disorder: Secondary | ICD-10-CM

## 2022-02-11 DIAGNOSIS — F341 Dysthymic disorder: Secondary | ICD-10-CM

## 2022-02-11 DIAGNOSIS — F410 Panic disorder [episodic paroxysmal anxiety] without agoraphobia: Secondary | ICD-10-CM | POA: Diagnosis not present

## 2022-02-11 MED ORDER — ESCITALOPRAM OXALATE 10 MG PO TABS: ORAL_TABLET | ORAL | 3 refills | Status: DC

## 2022-02-11 MED ORDER — PROPRANOLOL HCL 10 MG PO TABS: 10.0000 mg | ORAL_TABLET | Freq: Two times a day (BID) | ORAL | 5 refills | Status: DC

## 2022-02-11 NOTE — Progress Notes (Signed)
Penny Carter [id number] Aug 19, 2001 21 y.o.  Virtual Visit via Video Note  I connected with pt @ on 02/11/22 at  9:40 AM EDT by a video enabled telemedicine application and verified that I am speaking with the correct person using two identifiers.   I discussed the limitations of evaluation and management by telemedicine and the availability of in person appointments. The patient expressed understanding and agreed to proceed.  I discussed the assessment and treatment plan with the patient. The patient was provided an opportunity to ask questions and all were answered. The patient agreed with the plan and demonstrated an understanding of the instructions.   The patient was advised to call back or seek an in-person evaluation if the symptoms worsen or if the condition fails to improve as anticipated.  I provided 20 minutes of non-face-to-face time during this encounter.  The patient was located at home.  The provider was located at [facility].   Dorothyann Gibbs, NP   Subjective:   Patient ID:  Penny Carter is a 21 y.o. (DOB 2001/03/19) female.  Chief Complaint: No chief complaint on file.   HPI Penny Carter for follow-up of GAD, panic disorder, persistent depression.  Describes mood today as "ok". Pleasant. Mood symptoms - denies depression, anxiety, and irritability. Denies worry and rumination. Stating "I'm doing ok". Stopped the Zoloft due to side effects and has restarted the Lexapro at 10mg  daily. Continues to report - hand tremors - anorgasmia with 10mg  dose. Stable interest and motivation. Taking medications as prescribed.  Energy levels stable. Active, has a regular exercise routine.  Enjoys some usual interests and activities. Student. Mother and sister [place]. Spending time with family and friends.  Appetite adequate. Weight stable. Sleeps well most nights. Averages 9 to 10 hours. Focus and concentration stable. Completing tasks.  Managing aspects of household. Recently graduated from - plans to stay in [place]. Denies SI or HI.  Denies AH or VH.  Previous medication trials: Denies   Review of Systems:  Review of Systems  Musculoskeletal:  Negative for gait problem.  Neurological:  Negative for tremors.  Psychiatric/Behavioral:         Please refer to HPI    Medications: I have reviewed the patient's current medications.  Current Outpatient Medications  Medication Sig Dispense Refill   propranolol (INDERAL) 10 MG tablet Take 1 tablet (10 mg total) by mouth 2 (two) times daily. 60 tablet 5   escitalopram (LEXAPRO) 10 MG tablet Take 1/2 tablet (10 mg) by mouth daily for 7 days, then increase to 1 tablet (20 mg) daily 90 tablet 3   No current facility-administered medications for this visit.    Medication Side Effects: None  Allergies:  Allergies  Allergen Reactions   Latex     Past Medical History:  Diagnosis Date   Scoliosis     No family history on file.  Social History   Socioeconomic History   Marital status: Single    Spouse name: Not on file   Number of children: Not on file   Years of education: Not on file   Highest education level: Not on file  Occupational History   Not on file  Tobacco Use   Smoking status: Never   Smokeless tobacco: Never  Vaping Use   Vaping Use: Never used  Substance and Sexual Activity   Alcohol use: No   Drug use: No   Sexual activity: Not on file  Other Topics Concern  Not on file  Social History Narrative   Not on file   Social Determinants of Health   Financial Resource Strain: Not on file  Food Insecurity: Not on file  Transportation Needs: Not on file  Physical Activity: Not on file  Stress: Not on file  Social Connections: Not on file  Intimate Partner Violence: Not on file    Past Medical History, Surgical history, Social history, and Family history were reviewed and updated as appropriate.   Please see review of systems  for further details on the patient's review from today.   Objective:   Physical Exam:  There were no vitals taken for this visit.  Physical Exam Constitutional:      General: She is not in acute distress. Musculoskeletal:        General: No deformity.  Neurological:     Mental Status: She is alert and oriented to person, place, and time.     Coordination: Coordination normal.  Psychiatric:        Attention and Perception: Attention and perception normal. She does not perceive auditory or visual hallucinations.        Mood and Affect: Mood normal. Mood is not anxious or depressed. Affect is not labile, blunt, angry or inappropriate.        Speech: Speech normal.        Behavior: Behavior normal.        Thought Content: Thought content normal. Thought content is not paranoid or delusional. Thought content does not include homicidal or suicidal ideation. Thought content does not include homicidal or suicidal plan.        Cognition and Memory: Cognition and memory normal.        Judgment: Judgment normal.     Comments: Insight intact     Lab Review:  No results found for: "NA", "K", "CL", "CO2", "GLUCOSE", "BUN", "CREATININE", "CALCIUM", "PROT", "ALBUMIN", "AST", "ALT", "ALKPHOS", "BILITOT", "GFRNONAA", "GFRAA"  No results found for: "WBC", "RBC", "HGB", "HCT", "PLT", "MCV", "MCH", "MCHC", "RDW", "LYMPHSABS", "MONOABS", "EOSABS", "BASOSABS"  No results found for: "POCLITH", "LITHIUM"   No results found for: "PHENYTOIN", "PHENOBARB", "VALPROATE", "CBMZ"   .res Assessment: Plan:    Plan:  PDMP reviewed  1. Lexapro 10mg  daily - reduced from 20mg  2. Add Propranolol 10mg  BID  D/C Zoloft  RTC 3/4 months   Time spent with patient was 25 minutes. Greater than 50% of face to face time with patient was spent on counseling and coordination of care.     Patient advised to contact office with any questions, adverse effects, or acute worsening in signs and symptoms. Diagnoses and  all orders for this visit:  Persistent depressive disorder with anxious distress, currently moderate -     propranolol (INDERAL) 10 MG tablet; Take 1 tablet (10 mg total) by mouth 2 (two) times daily.  GAD (generalized anxiety disorder) -     escitalopram (LEXAPRO) 10 MG tablet; Take 1/2 tablet (10 mg) by mouth daily for 7 days, then increase to 1 tablet (20 mg) daily  Panic disorder     Please see After Visit Summary for patient specific instructions.  No future appointments.  No orders of the defined types were placed in this encounter.     -------------------------------

## 2022-02-16 DIAGNOSIS — X58XXXA Exposure to other specified factors, initial encounter: Secondary | ICD-10-CM | POA: Diagnosis not present

## 2022-02-16 DIAGNOSIS — J343 Hypertrophy of nasal turbinates: Secondary | ICD-10-CM | POA: Diagnosis not present

## 2022-02-16 DIAGNOSIS — J342 Deviated nasal septum: Secondary | ICD-10-CM | POA: Diagnosis not present

## 2022-02-16 DIAGNOSIS — S022XXA Fracture of nasal bones, initial encounter for closed fracture: Secondary | ICD-10-CM | POA: Diagnosis not present

## 2022-02-16 DIAGNOSIS — Y929 Unspecified place or not applicable: Secondary | ICD-10-CM | POA: Diagnosis not present

## 2022-02-22 DIAGNOSIS — Z48813 Encounter for surgical aftercare following surgery on the respiratory system: Secondary | ICD-10-CM | POA: Diagnosis not present

## 2022-02-22 DIAGNOSIS — J342 Deviated nasal septum: Secondary | ICD-10-CM | POA: Diagnosis not present

## 2022-06-02 DIAGNOSIS — Z1329 Encounter for screening for other suspected endocrine disorder: Secondary | ICD-10-CM | POA: Diagnosis not present

## 2022-06-02 DIAGNOSIS — Z23 Encounter for immunization: Secondary | ICD-10-CM | POA: Diagnosis not present

## 2022-06-02 DIAGNOSIS — N760 Acute vaginitis: Secondary | ICD-10-CM | POA: Diagnosis not present

## 2022-06-02 DIAGNOSIS — Z113 Encounter for screening for infections with a predominantly sexual mode of transmission: Secondary | ICD-10-CM | POA: Diagnosis not present

## 2022-06-17 DIAGNOSIS — F4323 Adjustment disorder with mixed anxiety and depressed mood: Secondary | ICD-10-CM | POA: Diagnosis not present

## 2022-06-21 DIAGNOSIS — L293 Anogenital pruritus, unspecified: Secondary | ICD-10-CM | POA: Diagnosis not present

## 2022-06-21 DIAGNOSIS — Z30430 Encounter for insertion of intrauterine contraceptive device: Secondary | ICD-10-CM | POA: Diagnosis not present

## 2022-06-21 DIAGNOSIS — Z118 Encounter for screening for other infectious and parasitic diseases: Secondary | ICD-10-CM | POA: Diagnosis not present

## 2022-06-21 DIAGNOSIS — N760 Acute vaginitis: Secondary | ICD-10-CM | POA: Diagnosis not present

## 2022-06-21 DIAGNOSIS — Z3202 Encounter for pregnancy test, result negative: Secondary | ICD-10-CM | POA: Diagnosis not present

## 2022-06-24 DIAGNOSIS — F4323 Adjustment disorder with mixed anxiety and depressed mood: Secondary | ICD-10-CM | POA: Diagnosis not present

## 2022-08-26 ENCOUNTER — Encounter: Payer: Self-pay | Admitting: Adult Health

## 2022-08-26 ENCOUNTER — Telehealth (INDEPENDENT_AMBULATORY_CARE_PROVIDER_SITE_OTHER): Payer: BC Managed Care – PPO | Admitting: Adult Health

## 2022-08-26 DIAGNOSIS — F411 Generalized anxiety disorder: Secondary | ICD-10-CM

## 2022-08-26 DIAGNOSIS — F410 Panic disorder [episodic paroxysmal anxiety] without agoraphobia: Secondary | ICD-10-CM | POA: Diagnosis not present

## 2022-08-26 DIAGNOSIS — F341 Dysthymic disorder: Secondary | ICD-10-CM | POA: Diagnosis not present

## 2022-08-26 MED ORDER — ESCITALOPRAM OXALATE 20 MG PO TABS: ORAL_TABLET | ORAL | 3 refills | Status: DC

## 2022-08-26 NOTE — Progress Notes (Signed)
Penny Carter [id number] 03/01/2001 21 y.o.  Virtual Visit via Video Note  I connected with pt @ on 08/26/22 at  1:40 PM EST by a video enabled telemedicine application and verified that I am speaking with the correct person using two identifiers.   I discussed the limitations of evaluation and management by telemedicine and the availability of in person appointments. The patient expressed understanding and agreed to proceed.  I discussed the assessment and treatment plan with the patient. The patient was provided an opportunity to ask questions and all were answered. The patient agreed with the plan and demonstrated an understanding of the instructions.   The patient was advised to call back or seek an in-person evaluation if the symptoms worsen or if the condition fails to improve as anticipated.  I provided 15 minutes of non-face-to-face time during this encounter.  The patient was located at home.  The provider was located at [facility].   Dorothyann Gibbs, NP   Subjective:   Patient ID:  Penny Carter is a 21 y.o. (DOB 12/23/2000) female.  Chief Complaint: No chief complaint on file.   HPI Penny Carter presents for follow-up of GAD, panic disorder, persistent depression.  Describes mood today as "ok". Pleasant. Mood symptoms - reports mild depression - "more seasonal". Denies anxiety and irritability. Denies worry and rumination. Mood is consistent. Stating "I'm doing ok". Reports decreased hand tremors - has not started the Propranolol. Stable interest and motivation. Taking medications as prescribed. Seeing a therapist. Energy levels stable. Active, has a regular exercise routine.  Enjoys some usual interests and activities. Single. Mother and sister [place]. Spending time with family and friends.  Appetite adequate. Weight stable. Sleeps well most nights. Averages 9 to 10 hours. Focus and concentration stable. Completing tasks. Managing aspects of  household. Recently graduated from [employer] - living alone in [place]. Denies SI or HI.  Denies AH or VH.  Previous medication trials: Denies   Review of Systems:  Review of Systems  Musculoskeletal:  Negative for gait problem.  Neurological:  Negative for tremors.  Psychiatric/Behavioral:         Please refer to HPI    Medications: I have reviewed the patient's current medications.  Current Outpatient Medications  Medication Sig Dispense Refill   escitalopram (LEXAPRO) 20 MG tablet Take one tablet (20 mg) daily 90 tablet 3   propranolol (INDERAL) 10 MG tablet Take 1 tablet (10 mg total) by mouth 2 (two) times daily. 60 tablet 5   No current facility-administered medications for this visit.    Medication Side Effects: None  Allergies:  Allergies  Allergen Reactions   Latex     Past Medical History:  Diagnosis Date   Scoliosis     No family history on file.  Social History   Socioeconomic History   Marital status: Single    Spouse name: Not on file   Number of children: Not on file   Years of education: Not on file   Highest education level: Not on file  Occupational History   Not on file  Tobacco Use   Smoking status: Never   Smokeless tobacco: Never  Vaping Use   Vaping Use: Never used  Substance and Sexual Activity   Alcohol use: No   Drug use: No   Sexual activity: Not on file  Other Topics Concern   Not on file  Social History Narrative   Not on file   Social Determinants of Health  Financial Resource Strain: Not on file  Food Insecurity: Not on file  Transportation Needs: Not on file  Physical Activity: Not on file  Stress: Not on file  Social Connections: Not on file  Intimate Partner Violence: Not on file    Past Medical History, Surgical history, Social history, and Family history were reviewed and updated as appropriate.   Please see review of systems for further details on the patient's review from today.   Objective:   Physical  Exam:  There were no vitals taken for this visit.  Physical Exam Constitutional:      General: She is not in acute distress. Musculoskeletal:        General: No deformity.  Neurological:     Mental Status: She is alert and oriented to person, place, and time.     Coordination: Coordination normal.  Psychiatric:        Attention and Perception: Attention and perception normal. She does not perceive auditory or visual hallucinations.        Mood and Affect: Mood normal. Mood is not anxious or depressed. Affect is not labile, blunt, angry or inappropriate.        Speech: Speech normal.        Behavior: Behavior normal.        Thought Content: Thought content normal. Thought content is not paranoid or delusional. Thought content does not include homicidal or suicidal ideation. Thought content does not include homicidal or suicidal plan.        Cognition and Memory: Cognition and memory normal.        Judgment: Judgment normal.     Comments: Insight intact     Lab Review:  No results found for: "NA", "K", "CL", "CO2", "GLUCOSE", "BUN", "CREATININE", "CALCIUM", "PROT", "ALBUMIN", "AST", "ALT", "ALKPHOS", "BILITOT", "GFRNONAA", "GFRAA"  No results found for: "WBC", "RBC", "HGB", "HCT", "PLT", "MCV", "MCH", "MCHC", "RDW", "LYMPHSABS", "MONOABS", "EOSABS", "BASOSABS"  No results found for: "POCLITH", "LITHIUM"   No results found for: "PHENYTOIN", "PHENOBARB", "VALPROATE", "CBMZ"   .res Assessment: Plan:    Plan:  PDMP reviewed  1. Lexapro 20mg  daily   2. Propranolol 10mg  BID  RTC 6 months   Time spent with patient was 25 minutes. Greater than 50% of face to face time with patient was spent on counseling and coordination of care.     Patient advised to contact office with any questions, adverse effects, or acute worsening in signs and symptoms.  Diagnoses and all orders for this visit:  Persistent depressive disorder with anxious distress, currently moderate  GAD  (generalized anxiety disorder) -     escitalopram (LEXAPRO) 20 MG tablet; Take one tablet (20 mg) daily  Panic disorder     Please see After Visit Summary for patient specific instructions.  No future appointments.   No orders of the defined types were placed in this encounter.     -------------------------------

## 2023-02-23 ENCOUNTER — Telehealth (INDEPENDENT_AMBULATORY_CARE_PROVIDER_SITE_OTHER): Payer: BC Managed Care – PPO | Admitting: Adult Health

## 2023-02-23 ENCOUNTER — Encounter: Payer: Self-pay | Admitting: Adult Health

## 2023-02-23 DIAGNOSIS — F411 Generalized anxiety disorder: Secondary | ICD-10-CM

## 2023-02-23 DIAGNOSIS — F32A Depression, unspecified: Secondary | ICD-10-CM

## 2023-02-23 DIAGNOSIS — F410 Panic disorder [episodic paroxysmal anxiety] without agoraphobia: Secondary | ICD-10-CM

## 2023-02-23 MED ORDER — BUSPIRONE HCL 10 MG PO TABS: ORAL_TABLET | ORAL | 2 refills | Status: DC

## 2023-02-23 MED ORDER — ESCITALOPRAM OXALATE 20 MG PO TABS: ORAL_TABLET | ORAL | 3 refills | Status: DC

## 2023-02-23 MED ORDER — LORAZEPAM 0.5 MG PO TABS: 0.5000 mg | ORAL_TABLET | Freq: Two times a day (BID) | ORAL | 2 refills | Status: AC

## 2023-02-23 NOTE — Progress Notes (Signed)
Penny Carter [id number] 03-Jan-2001 22 y.o.  Virtual Visit via Video Note  I connected with pt @ on 02/23/23 at 10:20 AM EDT by a video enabled telemedicine application and verified that I am speaking with the correct person using two identifiers.   I discussed the limitations of evaluation and management by telemedicine and the availability of in person appointments. The patient expressed understanding and agreed to proceed.  I discussed the assessment and treatment plan with the patient. The patient was provided an opportunity to ask questions and all were answered. The patient agreed with the plan and demonstrated an understanding of the instructions.   The patient was advised to call back or seek an in-person evaluation if the symptoms worsen or if the condition fails to improve as anticipated.  I provided 15 minutes of non-face-to-face time during this encounter.  The patient was located at home.  The provider was located at [facility].   Dorothyann Gibbs, NP   Subjective:   Patient ID:  Penny Carter is a 22 y.o. (DOB 07-08-2001) female.  Chief Complaint: No chief complaint on file.   HPI Penny Carter presents for follow-up of GAD, panic disorder, persistent depression.  Describes mood today as "ok". Pleasant. Mood symptoms - denies depression. Reports anxiety and irritability. Reports panic attacks. Reports worry, rumination, over thinking. Reports obsessive thoughts,and acts.  Mood is variable. Stating "I fel so uneasy". Reports hand tremors - has not started the Propranolol. Stable interest and motivation. Taking medications as prescribed. Seeing a therapist. Energy levels stable. Active, has a regular exercise routine.  Enjoys some usual interests and activities. Single. Mother and sister [place]. Spending time with family and friends.  Appetite decreased. Weight stable. Sleeps well most nights. Averages 7 to 8 hours. Focus and concentration  stable. Completing tasks. Managing aspects of household. Recently graduated from [employer] - living alone in [place]. Working as a Research scientist (medical).  Denies SI or HI.  Denies AH or VH.  Working with therapist.  Previous medication trials: Denies   Review of Systems:  Review of Systems  Musculoskeletal:  Negative for gait problem.  Neurological:  Negative for tremors.  Psychiatric/Behavioral:         Please refer to HPI    Medications: I have reviewed the patient's current medications.  Current Outpatient Medications  Medication Sig Dispense Refill   escitalopram (LEXAPRO) 20 MG tablet Take one tablet (20 mg) daily 90 tablet 3   propranolol (INDERAL) 10 MG tablet Take 1 tablet (10 mg total) by mouth 2 (two) times daily. 60 tablet 5   No current facility-administered medications for this visit.    Medication Side Effects: None  Allergies:  Allergies  Allergen Reactions   Latex     Past Medical History:  Diagnosis Date   Scoliosis     No family history on file.  Social History   Socioeconomic History   Marital status: Single    Spouse name: Not on file   Number of children: Not on file   Years of education: Not on file   Highest education level: Not on file  Occupational History   Not on file  Tobacco Use   Smoking status: Never   Smokeless tobacco: Never  Vaping Use   Vaping Use: Never used  Substance and Sexual Activity   Alcohol use: No   Drug use: No   Sexual activity: Not on file  Other Topics Concern   Not on file  Social History Narrative  Not on file   Social Determinants of Health   Financial Resource Strain: Not on file  Food Insecurity: Not on file  Transportation Needs: Not on file  Physical Activity: Not on file  Stress: Not on file  Social Connections: Not on file  Intimate Partner Violence: Not on file    Past Medical History, Surgical history, Social history, and Family history were reviewed and updated as appropriate.   Please see  review of systems for further details on the patient's review from today.   Objective:   Physical Exam:  There were no vitals taken for this visit.  Physical Exam Constitutional:      General: She is not in acute distress. Musculoskeletal:        General: No deformity.  Neurological:     Mental Status: She is alert and oriented to person, place, and time.     Coordination: Coordination normal.  Psychiatric:        Attention and Perception: Attention and perception normal. She does not perceive auditory or visual hallucinations.        Mood and Affect: Mood normal. Mood is not anxious or depressed. Affect is not labile, blunt, angry or inappropriate.        Speech: Speech normal.        Behavior: Behavior normal.        Thought Content: Thought content normal. Thought content is not paranoid or delusional. Thought content does not include homicidal or suicidal ideation. Thought content does not include homicidal or suicidal plan.        Cognition and Memory: Cognition and memory normal.        Judgment: Judgment normal.     Comments: Insight intact     Lab Review:  No results found for: "NA", "K", "CL", "CO2", "GLUCOSE", "BUN", "CREATININE", "CALCIUM", "PROT", "ALBUMIN", "AST", "ALT", "ALKPHOS", "BILITOT", "GFRNONAA", "GFRAA"  No results found for: "WBC", "RBC", "HGB", "HCT", "PLT", "MCV", "MCH", "MCHC", "RDW", "LYMPHSABS", "MONOABS", "EOSABS", "BASOSABS"  No results found for: "POCLITH", "LITHIUM"   No results found for: "PHENYTOIN", "PHENOBARB", "VALPROATE", "CBMZ"   .res Assessment: Plan:    Plan:  PDMP reviewed  1. Lexapro 20mg  daily.   2. Add Buspar 10mg  TID - take 1/2 tablet TID x 7 days, then increase to one tablets TID. 3. Add Lorazepam 0.5mg  BID  Discussed NAC   D/C Propranolol 10mg  BID  RTC 6 months   Time spent with patient was 25 minutes. Greater than 50% of face to face time with patient was spent on counseling and coordination of care.     Patient  advised to contact office with any questions, adverse effects, or acute worsening in signs and symptoms. There are no diagnoses linked to this encounter.   Please see After Visit Summary for patient specific instructions.  Future Appointments  Date Time Provider Department Center  02/23/2023 10:20 AM Andres Vest, Thereasa Solo, NP [facility] None    No orders of the defined types were placed in this encounter.     -------------------------------

## 2023-03-17 ENCOUNTER — Other Ambulatory Visit: Payer: Self-pay | Admitting: Adult Health

## 2023-03-17 DIAGNOSIS — F411 Generalized anxiety disorder: Secondary | ICD-10-CM

## 2023-06-16 ENCOUNTER — Other Ambulatory Visit: Payer: Self-pay | Admitting: Adult Health

## 2023-06-16 DIAGNOSIS — F411 Generalized anxiety disorder: Secondary | ICD-10-CM

## 2023-09-24 ENCOUNTER — Other Ambulatory Visit: Payer: Self-pay | Admitting: Adult Health

## 2023-09-24 DIAGNOSIS — F411 Generalized anxiety disorder: Secondary | ICD-10-CM

## 2023-09-26 NOTE — Telephone Encounter (Signed)
Please call to schedule FU, was due in Dec.

## 2023-09-30 NOTE — Telephone Encounter (Signed)
Has appt 2/6

## 2023-10-06 ENCOUNTER — Encounter: Payer: Self-pay | Admitting: Adult Health

## 2023-10-06 ENCOUNTER — Telehealth: Payer: BLUE CROSS/BLUE SHIELD | Admitting: Adult Health

## 2023-10-06 DIAGNOSIS — F410 Panic disorder [episodic paroxysmal anxiety] without agoraphobia: Secondary | ICD-10-CM

## 2023-10-06 DIAGNOSIS — F411 Generalized anxiety disorder: Secondary | ICD-10-CM | POA: Diagnosis not present

## 2023-10-06 MED ORDER — BUSPIRONE HCL 10 MG PO TABS: 10.0000 mg | ORAL_TABLET | Freq: Three times a day (TID) | ORAL | 3 refills | Status: DC

## 2023-10-06 MED ORDER — ESCITALOPRAM OXALATE 20 MG PO TABS: ORAL_TABLET | ORAL | 3 refills | Status: DC

## 2023-10-06 NOTE — Progress Notes (Signed)
 Penny Carter [id number] [birth year] 23 y.o.  Virtual Visit via Video Note  I connected with pt @ on 10/06/23 at  2:30 PM EST by a video enabled telemedicine application and verified that I am speaking with the correct person using two identifiers.   I discussed the limitations of evaluation and management by telemedicine and the availability of in person appointments. The patient expressed understanding and agreed to proceed.  I discussed the assessment and treatment plan with the patient. The patient was provided an opportunity to ask questions and all were answered. The patient agreed with the plan and demonstrated an understanding of the instructions.   The patient was advised to call back or seek an in-person evaluation if the symptoms worsen or if the condition fails to improve as anticipated.  I provided 10 minutes of non-face-to-face time during this encounter.  The patient was located at home.  The provider was located at [facility].   Angeline LOISE Sayers, NP   Subjective:   Patient ID:  Penny Carter is a 23 y.o. (DOB 2001/07/08) female.  Chief Complaint: No chief complaint on file.   HPI Kischa Altice presents for follow-up of GAD and panic disorder.  Describes mood today as ok. Pleasant. Mood symptoms - denies depression, anxiety, and irritability.  Denies panic attacks. Denies worry, rumination and over thinking. Denies obsessive thoughts and acts. Mood is variable. Stating I feel like I'm in a really good spot right now. Reports mild hand tremors.  Stable interest and motivation. Taking medications as prescribed. Seeing a therapist. Energy levels stable. Active, has a regular exercise routine.  Enjoys some usual interests and activities. Single. Mother and sister [place]. Spending time with family and friends.  Appetite decreased. Weight stable. Sleeps well most nights. Averages 8 or more hours. Focus and concentration stable. Completing  tasks. Managing aspects of household. Recently graduated from [employer] - living alone in [place]. 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  Denies AH or VH. Denies self harm. Denies substance use.   Working with therapist.  Previous medication trials: Denies   Review of Systems:  Review of Systems  Musculoskeletal:  Negative for gait problem.  Neurological:  Negative for tremors.  Psychiatric/Behavioral:         Please refer to HPI    Medications: I have reviewed the patient's current medications.  Current Outpatient Medications  Medication Sig Dispense Refill   busPIRone  (BUSPAR ) 10 MG tablet TAKE 1 TABLET BY MOUTH THREE TIMES A DAY 90 tablet 0   escitalopram  (LEXAPRO ) 20 MG tablet Take one tablet (20 mg) daily 90 tablet 3   LORazepam  (ATIVAN ) 0.5 MG tablet Take 1 tablet (0.5 mg total) by mouth 2 (two) times daily. 60 tablet 2   propranolol  (INDERAL ) 10 MG tablet Take 1 tablet (10 mg total) by mouth 2 (two) times daily. 60 tablet 5   No current facility-administered medications for this visit.    Medication Side Effects: None  Allergies:  Allergies  Allergen Reactions   Latex     Past Medical History:  Diagnosis Date   Scoliosis     No family history on file.  Social History   Socioeconomic History   Marital status: Single    Spouse name: Not on file   Number of children: Not on file   Years of education: Not on file   Highest education level: Not on file  Occupational History   Not on file  Tobacco Use  Smoking status: Never   Smokeless tobacco: Never  Vaping Use   Vaping status: Never Used  Substance and Sexual Activity   Alcohol use: No   Drug use: No   Sexual activity: Not on file  Other Topics Concern   Not on file  Social History Narrative   Not on file   Social Drivers of Health   Financial Resource Strain: Not on file  Food Insecurity: Not on file  Transportation Needs: Not on file  Physical Activity: Not on file  Stress: Not on file   Social Connections: Not on file  Intimate Partner Violence: Not on file    Past Medical History, Surgical history, Social history, and Family history were reviewed and updated as appropriate.   Please see review of systems for further details on the patient's review from today.   Objective:   Physical Exam:  There were no vitals taken for this visit.  Physical Exam Constitutional:      General: She is not in acute distress. Musculoskeletal:        General: No deformity.  Neurological:     Mental Status: She is alert and oriented to person, place, and time.     Coordination: Coordination normal.  Psychiatric:        Attention and Perception: Attention and perception normal. She does not perceive auditory or visual hallucinations.        Mood and Affect: Affect is not labile, blunt, angry or inappropriate.        Speech: Speech normal.        Behavior: Behavior normal.        Thought Content: Thought content normal. Thought content is not paranoid or delusional. Thought content does not include homicidal or suicidal ideation. Thought content does not include homicidal or suicidal plan.        Cognition and Memory: Cognition and memory normal.        Judgment: Judgment normal.     Comments: Insight intact     Lab Review:  No results found for: NA, K, CL, CO2, GLUCOSE, BUN, CREATININE, CALCIUM, PROT, ALBUMIN, AST, ALT, ALKPHOS, BILITOT, GFRNONAA, GFRAA  No results found for: WBC, RBC, HGB, HCT, PLT, MCV, MCH, MCHC, RDW, LYMPHSABS, MONOABS, EOSABS, BASOSABS  No results found for: POCLITH, LITHIUM   No results found for: PHENYTOIN, PHENOBARB, VALPROATE, CBMZ   .res Assessment: Plan:    Plan:  PDMP reviewed   RTC 6 months   10 minutes spent dedicated to the care of this patient on the date of this encounter to include pre-visit review of records, ordering of medication, post visit documentation, and  face-to-face time with the patient discussing depression, anxiety and OCD. Discussed continuing current medication regimen.  Patient advised to contact office with any questions, adverse effects, or acute worsening in signs and symptoms.    There are no diagnoses linked to this encounter.   Please see After Visit Summary for patient specific instructions.  Future Appointments  Date Time Provider Department Center  10/06/2023  2:30 PM Yanis Larin Nattalie, NP [facility] None    No orders of the defined types were placed in this encounter.     -------------------------------

## 2024-03-08 ENCOUNTER — Telehealth: Payer: Self-pay | Admitting: Adult Health

## 2024-03-08 ENCOUNTER — Other Ambulatory Visit: Payer: Self-pay

## 2024-03-08 DIAGNOSIS — F411 Generalized anxiety disorder: Secondary | ICD-10-CM

## 2024-03-08 MED ORDER — ESCITALOPRAM OXALATE 20 MG PO TABS: ORAL_TABLET | ORAL | 0 refills | Status: DC

## 2024-03-08 MED ORDER — BUSPIRONE HCL 10 MG PO TABS: 10.0000 mg | ORAL_TABLET | Freq: Three times a day (TID) | ORAL | 0 refills | Status: DC

## 2024-03-08 NOTE — Telephone Encounter (Signed)
 Pt is requesting hr prescriptions of Buspar  and Lexapro  be changed to    [facility]  [address] [place] [address]

## 2024-03-08 NOTE — Telephone Encounter (Signed)
 Pt due for apt in August-6 month follow up Will send to [facility] per request

## 2024-05-14 ENCOUNTER — Telehealth: Payer: Self-pay | Admitting: Adult Health

## 2024-05-14 DIAGNOSIS — F411 Generalized anxiety disorder: Secondary | ICD-10-CM

## 2024-05-16 NOTE — Telephone Encounter (Addendum)
Please call to schedule FU, was due in August.

## 2024-05-17 NOTE — Telephone Encounter (Signed)
 Lvm for pt to call and schedule

## 2024-05-17 NOTE — Telephone Encounter (Signed)
Please call to schedule FU, is past due.

## 2024-05-18 NOTE — Telephone Encounter (Signed)
 Addressed via pharmacy interface

## 2024-05-18 NOTE — Telephone Encounter (Signed)
 Pt made an appt for 10/01. Please refill medicine

## 2024-05-30 ENCOUNTER — Encounter: Payer: Self-pay | Admitting: Adult Health

## 2024-05-30 ENCOUNTER — Telehealth (INDEPENDENT_AMBULATORY_CARE_PROVIDER_SITE_OTHER): Admitting: Adult Health

## 2024-05-30 DIAGNOSIS — F410 Panic disorder [episodic paroxysmal anxiety] without agoraphobia: Secondary | ICD-10-CM

## 2024-05-30 DIAGNOSIS — F411 Generalized anxiety disorder: Secondary | ICD-10-CM

## 2024-05-30 MED ORDER — BUSPIRONE HCL 10 MG PO TABS: 10.0000 mg | ORAL_TABLET | Freq: Three times a day (TID) | ORAL | 1 refills | Status: AC

## 2024-05-30 MED ORDER — ESCITALOPRAM OXALATE 20 MG PO TABS: 20.0000 mg | ORAL_TABLET | Freq: Every day | ORAL | 1 refills | Status: AC

## 2024-05-30 NOTE — Progress Notes (Signed)
 Earleen Aoun [id number] [birth year] 23 y.o.  Virtual Visit via Video Note  I connected with pt @ on 05/30/24 at  1:30 PM EDT by a video enabled telemedicine application and verified that I am speaking with the correct person using two identifiers.   I discussed the limitations of evaluation and management by telemedicine and the availability of in person appointments. The patient expressed understanding and agreed to proceed.  I discussed the assessment and treatment plan with the patient. The patient was provided an opportunity to ask questions and all were answered. The patient agreed with the plan and demonstrated an understanding of the instructions.   The patient was advised to call back or seek an in-person evaluation if the symptoms worsen or if the condition fails to improve as anticipated.  I provided 10 minutes of non-face-to-face time during this encounter.  The patient was located at home.  The provider was located at [facility].   Angeline LOISE Sayers, NP   Subjective:   Patient ID:  Penny Carter is a 23 y.o. (DOB 2001/08/06) female.  Chief Complaint: No chief complaint on file.   HPI Penny Carter presents for follow-up of GAD and panic disorder.  Describes mood today as ok. Pleasant. Mood symptoms - denies depression, anxiety, and irritability. Reports stable interest and motivation. Denies panic attacks. Denies worry, rumination and over thinking. Denies obsessive thoughts and acts. Mood is stable. Stating I feel like I'm doing good. Taking medications as prescribed. Energy levels stable. Active, has a regular exercise routine.  Enjoys some usual interests and activities. Dating. Has a boyfriend. Mother in [place]. Sister in [employer] - [place]. Spending time with family and friends.  Appetite adequate. Weight stable. Sleeps well most nights. Averages 8 or more hours. Focus and concentration stable. Completing tasks. Managing aspects of  household. Recently graduated from [employer] - living alone in [place]. Working as a Research scientist (medical).  Denies SI or HI.  Denies AH or VH. Denies self harm. Denies substance use.   Working with therapist.  Previous medication trials: Denies  Review of Systems:  Review of Systems  Musculoskeletal:  Negative for gait problem.  Neurological:  Negative for tremors.  Psychiatric/Behavioral:         Please refer to HPI    Medications: I have reviewed the patient's current medications.  Current Outpatient Medications  Medication Sig Dispense Refill   busPIRone  (BUSPAR ) 10 MG tablet Take 1 tablet (10 mg total) by mouth 3 (three) times daily. 270 tablet 0   escitalopram  (LEXAPRO ) 20 MG tablet Take 1 tablet by mouth daily. 30 tablet 0   LORazepam  (ATIVAN ) 0.5 MG tablet Take 1 tablet (0.5 mg total) by mouth 2 (two) times daily. 60 tablet 2   propranolol  (INDERAL ) 10 MG tablet Take 1 tablet (10 mg total) by mouth 2 (two) times daily. 60 tablet 5   No current facility-administered medications for this visit.    Medication Side Effects: None  Allergies:  Allergies  Allergen Reactions   Latex     Past Medical History:  Diagnosis Date   Scoliosis     No family history on file.  Social History   Socioeconomic History   Marital status: Single    Spouse name: Not on file   Number of children: Not on file   Years of education: Not on file   Highest education level: Not on file  Occupational History   Not on file  Tobacco Use   Smoking status: Never  Smokeless tobacco: Never  Vaping Use   Vaping status: Never Used  Substance and Sexual Activity   Alcohol use: No   Drug use: No   Sexual activity: Not on file  Other Topics Concern   Not on file  Social History Narrative   Not on file   Social Drivers of Health   Financial Resource Strain: Not on file  Food Insecurity: Not on file  Transportation Needs: Not on file  Physical Activity: Not on file  Stress: Not on file  Social  Connections: Not on file  Intimate Partner Violence: Not on file    Past Medical History, Surgical history, Social history, and Family history were reviewed and updated as appropriate.   Please see review of systems for further details on the patient's review from today.   Objective:   Physical Exam:  There were no vitals taken for this visit.  Physical Exam Constitutional:      General: She is not in acute distress. Musculoskeletal:        General: No deformity.  Neurological:     Mental Status: She is alert and oriented to person, place, and time.     Coordination: Coordination normal.  Psychiatric:        Attention and Perception: Attention and perception normal. She does not perceive auditory or visual hallucinations.        Mood and Affect: Mood normal. Mood is not anxious or depressed. Affect is not labile, blunt, angry or inappropriate.        Speech: Speech normal.        Behavior: Behavior normal.        Thought Content: Thought content normal. Thought content is not paranoid or delusional. Thought content does not include homicidal or suicidal ideation. Thought content does not include homicidal or suicidal plan.        Cognition and Memory: Cognition and memory normal.        Judgment: Judgment normal.     Comments: Insight intact     Lab Review:  No results found for: NA, K, CL, CO2, GLUCOSE, BUN, CREATININE, CALCIUM, PROT, ALBUMIN, AST, ALT, ALKPHOS, BILITOT, GFRNONAA, GFRAA  No results found for: WBC, RBC, HGB, HCT, PLT, MCV, MCH, MCHC, RDW, LYMPHSABS, MONOABS, EOSABS, BASOSABS  No results found for: POCLITH, LITHIUM   No results found for: PHENYTOIN, PHENOBARB, VALPROATE, CBMZ   .res Assessment: Plan:    Plan:  PDMP reviewed  Plan:  PDMP reviewed  1. Lexapro  20mg  daily.   2. Buspar  10mg  TID to BID  Discussed NAC   RTC 6 months  10 minutes spent dedicated to the care of this  patient on the date of this encounter to include pre-visit review of records, ordering of medication, post visit documentation, and face-to-face time with the patient discussing depression, anxiety and OCD. Discussed continuing current medication regimen.  Patient advised to contact office with any questions, adverse effects, or acute worsening in signs and symptoms.    There are no diagnoses linked to this encounter.   Please see After Visit Summary for patient specific instructions.  Future Appointments  Date Time Provider Department Center  05/30/2024  1:30 PM Caydance Kuehnle Nattalie, NP [facility] None    No orders of the defined types were placed in this encounter.     -------------------------------

## 2024-11-28 ENCOUNTER — Telehealth: Admitting: Adult Health
